# Patient Record
Sex: Female | Born: 1970
Health system: Southern US, Community
[De-identification: ages and names within clinical notes are randomized; demographics above are authoritative.]

## PROBLEM LIST (undated history)

## (undated) DIAGNOSIS — T07XXXA Unspecified multiple injuries, initial encounter: Secondary | ICD-10-CM

## (undated) HISTORY — DX: Unspecified multiple injuries, initial encounter: T07.XXXA

## (undated) HISTORY — PX: ABDOMINAL SURGERY: SHX537

## (undated) HISTORY — PX: OTHER SURGICAL HISTORY: SHX169

## (undated) HISTORY — PX: GANGLION CYST EXCISION: SHX1691

---
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
## 2000-04-26 HISTORY — PX: COLECTOMY: SHX59

## 2000-10-31 ENCOUNTER — Other Ambulatory Visit: Admission: RE | Admit: 2000-10-31 | Discharge: 2000-10-31 | Payer: Self-pay | Admitting: Obstetrics and Gynecology

## 2001-06-09 ENCOUNTER — Inpatient Hospital Stay (HOSPITAL_COMMUNITY): Admission: AD | Admit: 2001-06-09 | Discharge: 2001-06-11 | Payer: Self-pay | Admitting: Obstetrics & Gynecology

## 2001-07-20 ENCOUNTER — Ambulatory Visit (HOSPITAL_COMMUNITY): Admission: RE | Admit: 2001-07-20 | Discharge: 2001-07-20 | Payer: Self-pay | Admitting: Obstetrics & Gynecology

## 2003-10-24 ENCOUNTER — Ambulatory Visit (HOSPITAL_COMMUNITY): Admission: RE | Admit: 2003-10-24 | Discharge: 2003-10-24 | Payer: Self-pay | Admitting: Internal Medicine

## 2003-10-30 ENCOUNTER — Ambulatory Visit (HOSPITAL_COMMUNITY): Admission: RE | Admit: 2003-10-30 | Discharge: 2003-10-30 | Payer: Self-pay | Admitting: Internal Medicine

## 2004-02-14 ENCOUNTER — Ambulatory Visit (HOSPITAL_COMMUNITY): Admission: RE | Admit: 2004-02-14 | Discharge: 2004-02-14 | Payer: Self-pay | Admitting: Internal Medicine

## 2007-04-24 ENCOUNTER — Other Ambulatory Visit: Admission: RE | Admit: 2007-04-24 | Discharge: 2007-04-24 | Payer: Self-pay | Admitting: Obstetrics and Gynecology

## 2007-04-27 HISTORY — PX: VAGINAL HYSTERECTOMY: SHX2639

## 2007-10-11 ENCOUNTER — Encounter: Payer: Self-pay | Admitting: Obstetrics & Gynecology

## 2007-10-11 ENCOUNTER — Inpatient Hospital Stay (HOSPITAL_COMMUNITY): Admission: RE | Admit: 2007-10-11 | Discharge: 2007-10-13 | Payer: Self-pay | Admitting: Obstetrics & Gynecology

## 2010-03-30 ENCOUNTER — Ambulatory Visit (HOSPITAL_COMMUNITY)
Admission: RE | Admit: 2010-03-30 | Discharge: 2010-03-30 | Payer: Self-pay | Source: Home / Self Care | Admitting: Internal Medicine

## 2010-09-08 NOTE — Op Note (Signed)
[name], Alexandra Espinoza                 ACCOUNT NO.:  [id number]   MEDICAL RECORD NO.:  [id number]          PATIENT TYPE:  INP   LOCATION:  A308                          FACILITY:  [facility]   PHYSICIAN:  Lazaro Arms, M.D.   DATE OF BIRTH:  01/07/1971   DATE OF PROCEDURE:  10/11/2007  DATE OF DISCHARGE:                               OPERATIVE REPORT   PREOPERATIVE DIAGNOSES:  1. Dysmenorrhea.  2. Dyspareunia.   POSTOPERATIVE DIAGNOSES:  1. Dysmenorrhea.  2. Dyspareunia.   PROCEDURE:  Abdominal hysterectomy.   SURGEON:  Lazaro Arms, MD.   ANESTHESIA:  General endotracheal.   FINDINGS:  The patient had what appeared to be a normal uterus.  She  does have some adhesions of her uterus and anterior wall from previous  incision.  Crohn's disease appear to be quite.  Small bowel was  evaluated and found to be normal.  Ovaries were normal.   DESCRIPTION OF OPERATION:  The patient was taken to the operating room  and placed in supine position where she underwent general endotracheal  anesthesia.  The vagina was prepped, Foley catheter was placed.  The  abdomen was then prepped and draped in the usual sterile fashion.  A  Pfannenstiel skin incision was made, carried down sharply to the rectus  fascia which was scored in midline and extended laterally.  The fascia  was taken off the muscle superiorly and inferiorly without difficulty.  The muscles were divided.  Peritoneal cavity was entered.  An Alexis  self-retaining wound retractor was then placed.  The uterine cornu were  grasped.  The round ligaments were suture ligated and cut bilaterally.  The vesicouterine serosal flap was created.  The utero-ovarian ligaments  were then clamped, cut, and suture ligated bilaterally.  The uterine  vessels were skeletonized, clamped, cut, and suture ligated.  Serial  pedicles were taken down the cervix to the cardinal ligament.  Each  pedicle being clamped, cut, and suture ligated.  The vagina was  then  cross-clamped. Specimens were removed.  Vaginal angle sutures were  placed.  The vagina was closed with interrupted figure-of-eight sutures.  Pelvis was irrigated vigorously.  All pedicles were found to be  hemostatic.  Intercede was placed over the vaginal cuff to try to  prevent postoperative adhesions.  The wound retractor and the packing  laps were removed.  The muscles and peritoneum were reapproximated  loosely.  The fascia closed using 0 Vicryl running.  Subcutaneous tissue  was made hemostatic and irrigated and reapproximated loosely.  The skin  was closed using 3-0 Vicryl on a Keith needle in subcuticular fashion.  Dermabond was placed for  additional security.  The patient was awakened from anesthesia, taken to  recovery room in good stable condition.  All counts were correct.  She  experienced 150 mL of blood loss and received a gram of Ancef  prophylactically preoperatively.      Lazaro Arms, M.D.  Electronically Signed     LHE/MEDQ  D:  10/11/2007  T:  10/11/2007  Job:  [id number]

## 2010-09-11 NOTE — Discharge Summary (Signed)
Alexandra Espinoza, Alexandra Espinoza                 ACCOUNT NO.:  [id number]   MEDICAL RECORD NO.:  [id number]          PATIENT TYPE:  INP   LOCATION:  A308                          FACILITY:  [facility]   PHYSICIAN:  Lazaro Arms, M.D.   DATE OF BIRTH:  12-04-1970   DATE OF ADMISSION:  10/11/2007  DATE OF DISCHARGE:  06/19/2009LH                               DISCHARGE SUMMARY   DISCHARGE DIAGNOSES:  1. Status post total abdominal hysterectomy.  2. Unremarkable postoperative course.   PROCEDURE:  Total abdominal hysterectomy.   Please refer to the history and physical and the operative report for  details of admission to the hospital.   HOSPITAL COURSE:  The patient had unremarkable postoperative course.  She tolerated clear liquids and a regular diet, voided without symptoms,  and was extensively ambulatory.  Her postop day #1, hemoglobin was 10.8  and hematocrit of 31.6; postop day #2 was 10.4 and 30.5, and white count  was 6900.  She was afebrile.  She had progression with normal bowel  function.  Incision was clean, dry, and intact.  She was discharged in  the morning of postop day #2 in good stable condition.  Follow up in the  office next week for incision evaluation.  She was given routine  postoperative instructions.  She was given Percocet, Motrin, and  Phenergan for postop prescriptions.      Lazaro Arms, M.D.  Electronically Signed     LHE/MEDQ  D:  12/22/2007  T:  12/22/2007  Job:  [id number]

## 2010-09-11 NOTE — Op Note (Signed)
[facility]  Patient:    Alexandra Espinoza, Alexandra Espinoza Visit Number: [id number] MRN: [id number]          Service Type: OBS Location: 4A A417 01 Attending Physician:  Lazaro Arms Dictated by:   Rockne Coons., M.D. Proc. Date: 06/09/01 Admit Date:  06/09/2001                             Operative Report  DELIVERY NOTE  INDICATIONS:  Raaga is 38-3/[redacted] weeks gestation admitted in prodromal labor.  DESCRIPTION OF PROCEDURE:  She underwent an amniotomy and labored spontaneously without difficulty.  She had some variables and/or head compression with pushing but otherwise strip looked great.  There was clear amniotic fluid.  The patient had an approximately 40 minute second stage of labor and was delivered over an intact perineum of a viable female at 2040 with Apgars of 8 and 9, weight 6 pounds 13 ounces.  There was a three vessel cord.  Cord blood was sent.  The placenta was normal and intact.  The uterus was firm after delivery.  The infant was handed to the nursery nurse who was in attendance for routine neonatal resuscitation on the mothers abdomen.  The baby had spontaneous respirations and again had Apgars of 8 and 9.  There was a first degree peroneal laceration which was repaired without difficulty, and the patient will be continued on Oxytocin stimulation over the next 8-12 hours and she will undergo routine postpartum care.  She is B positive and rubella is not on the chart.  We will attempt to try to retain that.  She is group B strep negative and she is breast feeding.  I anticipate routine postpartum care. Dictated by:   Rockne Coons., M.D. Attending Physician:  Lazaro Arms DD:  06/09/01 TD:  06/10/01 Job: [id number]

## 2010-09-11 NOTE — H&P (Signed)
[facility]  Patient:    Alexandra Espinoza, Alexandra Espinoza Visit Number: [id number] MRN: [id number]          Service Type: OBS Location: 4A A417 01 Attending Physician:  Lazaro Arms Dictated by:   Duane Lope, M.D. Admit Date:  06/09/2001                           History and Physical  DATE OF BIRTH:  1971-02-20  HISTORY OF PRESENT ILLNESS:  The patient is a 40 year old white female, gravida 3, para 2, abortus 0, with an estimated date of delivery of June 16, 2001, by last menstrual period and a six-week sonogram.  Currently at 38-3/[redacted] weeks gestation.  The patient was seen in the office today complaining of some spotting and cramping.  Her cervix was 2-3, 50%, and about a -2 station.  She was felt to be in prodromal labor with some bloody discharge. She was sent over to labor and delivery and had a reactive NST and was having infrequent contractions.  As a result, she was sent to the ambulatory with the idea that she would pick up and continue with amniotomy.  PAST MEDICAL HISTORY: 1. Crohns disease.  She had two feet of her large intestine removed in    April 2002. 2. Exploratory laparotomy and appendectomy in 1999.  That was all done by Dr. Byrd Hesselbach over at [facility].  OBSTETRICAL HISTORY:  Two vaginal deliveries, in 1992 and 1996, both in the 4-pound range and weight.  MEDICATIONS:  Prenatal vitamins.  ALLERGIES:  PENICILLIN and FLAGYL.  REVIEW OF SYSTEMS:  Otherwise negative.  FAMILY HISTORY:  Otherwise noncontributory to the pregnancy.  PHYSICAL EXAMINATION:  VITAL SIGNS:  Weight 157 pounds.  Blood pressure 124/78.  HEENT:  Unremarkable.  NECK:  Normal thyroid.  LUNGS:  Clear.  BREASTS:  Deferred.  HEART:  Regular rate and rhythm, with 2/6 systolic ejection murmur.  ABDOMEN:  Gravid.  Fundal height of 36 cm.  PELVIC:  Cervix is 2-3, 50, and -1 to -2.  There is a reactive NST.  EXTREMITIES:  Warm, with trace  edema.  NEUROLOGIC:  Grossly intact.  IMPRESSION: 1. Intrauterine pregnancy at term. 2. Early prodromal labor.  PLAN:  Will let patient ambulate, and plan amniotomy and/or augmentation later.  LABORATORY DATA:  Blood type is B positive.  Rubella is immune.  The rest of her laboratory data is normal. Dictated by:   Duane Lope, M.D. Attending Physician:  Lazaro Arms DD:  06/09/01 TD:  06/10/01 Job: [id number]

## 2010-09-11 NOTE — H&P (Signed)
[facility]  Patient:    Alexandra Espinoza, Alexandra Espinoza Visit Number: [id number] MRN: [id number]          Service Type: OBS Location: 4A A417 01 Attending Physician:  Lazaro Arms Dictated by:   Duane Lope, M.D. Admit Date:  06/09/2001 Discharge Date: 06/11/2001                           History and Physical  DATE OF BIRTH:  [birth year]  REASON FOR ADMISSION:  An outpatient laparoscopic tubal ligation.  HISTORY OF PRESENT ILLNESS:  Alexandra Espinoza is a 40 year old white female, gravida 3, para 3, who has not been sexually active at all since her delivery on the 14th, who is admitted for a laparoscopic bilateral tubal ligation.  She understands the risk of failure of about 1 in 200 and the permanent nature of the sterilization.  Complicating matters somewhat is her history of Crohns disease, since she has been diagnosed since 1990 when she had an exploration and appendectomy and she was found to have Crohns.  She subsequently had an exploratory laparotomy with a small-bowel resection and what sounds like some ileocecal area resection of about two feet in March of 2002; she became pregnant shortly thereafter.  She has had no problems from her Crohns since then and is on no medications.  PAST MEDICAL HISTORY:  Crohns.  PAST SURGICAL HISTORY:  Appendectomy in 1990 and exploratory laparotomy in March of 2002.  PAST OBSTETRICAL HISTORY:  Three vaginal deliveries.  REVIEW OF SYSTEMS:  Otherwise negative.  MEDICINES:  Currently none.  ALLERGIES:  PENICILLIN and FLAGYL.  PHYSICAL EXAMINATION:  VITAL SIGNS:  Weight is 141 pounds.  Blood pressure is 120/80.  HEENT/NECK:  Unremarkable with normal thyroid.  LUNGS:  Clear.  HEART:  Regular rate and rhythm without murmur, regurgitation or gallop.  BREASTS:  Exam is deferred.  ABDOMEN:  Well-healed midline scar and a right lower quadrant McBurneys scar. The abdominal exam is otherwise benign.  PELVIC:  She has  normal external genitalia.  Vagina is pink and moist without discharge.  Cervix is parous without lesions.  Uterus is slightly retroverted. The ovaries are normal size and nontender.  EXTREMITIES:  Warm with no edema.  NEUROLOGIC:  Exam is grossly intact.  IMPRESSION: 1. Desires permanent sterilization. 2. History of Crohns disease with exploratory laparotomy.  PLAN:  Patient will be admitted as an outpatient on the 27th for laparoscopic tubal ligation.  She understands, however, that due to her Crohns disease, may require a laparotomy and I told her we would do that if needed and she agrees with that; as a result, the permit will say laparoscopic bilateral tubal ligation and possible minilaparotomy. Dictated by:   Duane Lope, M.D. Attending Physician:  Lazaro Arms DD:  07/07/01 TD:  07/08/01 Job: [id number]
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
## 2011-01-21 LAB — DIFFERENTIAL
Basophils Relative: 0
Eosinophils Absolute: 0
Eosinophils Absolute: 0
Eosinophils Relative: 0
Eosinophils Relative: 1
Lymphocytes Relative: 6 — ABNORMAL LOW
Lymphs Abs: 1.3
Monocytes Absolute: 0.7
Monocytes Relative: 10

## 2011-01-21 LAB — URINALYSIS, ROUTINE W REFLEX MICROSCOPIC
Glucose, UA: NEGATIVE
Hgb urine dipstick: NEGATIVE
Protein, ur: NEGATIVE

## 2011-01-21 LAB — CBC
HCT: 35.9 — ABNORMAL LOW
Hemoglobin: 10.4 — ABNORMAL LOW
Hemoglobin: 10.8 — ABNORMAL LOW
MCHC: 33.9
MCV: 74.4 — ABNORMAL LOW
Platelets: 191
Platelets: 256
RBC: 4.03
RBC: 4.21
RBC: 4.82
RDW: 16.6 — ABNORMAL HIGH
WBC: 11.9 — ABNORMAL HIGH
WBC: 5.8
WBC: 6.9

## 2011-01-21 LAB — COMPREHENSIVE METABOLIC PANEL
ALT: 17
AST: 21
Albumin: 4.2
Calcium: 9.7
Creatinine, Ser: 0.67
Potassium: 4.2
Sodium: 138
Total Protein: 6.9

## 2011-03-24 DIAGNOSIS — R079 Chest pain, unspecified: Secondary | ICD-10-CM

## 2011-12-21 ENCOUNTER — Other Ambulatory Visit (HOSPITAL_COMMUNITY): Payer: Self-pay | Admitting: Internal Medicine

## 2011-12-21 DIAGNOSIS — Z139 Encounter for screening, unspecified: Secondary | ICD-10-CM

## 2011-12-23 ENCOUNTER — Ambulatory Visit (HOSPITAL_COMMUNITY)
Admission: RE | Admit: 2011-12-23 | Discharge: 2011-12-23 | Disposition: A | Discharge status: Home / Self Care | Payer: BC Managed Care – PPO | Source: Ambulatory Visit | Attending: Internal Medicine | Admitting: Internal Medicine

## 2011-12-23 DIAGNOSIS — Z139 Encounter for screening, unspecified: Secondary | ICD-10-CM

## 2011-12-23 DIAGNOSIS — Z1231 Encounter for screening mammogram for malignant neoplasm of breast: Secondary | ICD-10-CM | POA: Insufficient documentation

## 2016-02-24 DIAGNOSIS — M79673 Pain in unspecified foot: Secondary | ICD-10-CM | POA: Diagnosis not present

## 2016-02-24 DIAGNOSIS — G5762 Lesion of plantar nerve, left lower limb: Secondary | ICD-10-CM | POA: Diagnosis not present

## 2016-03-16 DIAGNOSIS — M79673 Pain in unspecified foot: Secondary | ICD-10-CM | POA: Diagnosis not present

## 2016-03-16 DIAGNOSIS — G5762 Lesion of plantar nerve, left lower limb: Secondary | ICD-10-CM | POA: Diagnosis not present

## 2017-09-08 DIAGNOSIS — Z0000 Encounter for general adult medical examination without abnormal findings: Secondary | ICD-10-CM | POA: Diagnosis not present

## 2017-09-15 DIAGNOSIS — M545 Low back pain: Secondary | ICD-10-CM | POA: Diagnosis not present

## 2017-09-15 DIAGNOSIS — M79606 Pain in leg, unspecified: Secondary | ICD-10-CM | POA: Diagnosis not present

## 2017-09-15 DIAGNOSIS — Z0000 Encounter for general adult medical examination without abnormal findings: Secondary | ICD-10-CM | POA: Diagnosis not present

## 2017-09-15 DIAGNOSIS — G8929 Other chronic pain: Secondary | ICD-10-CM | POA: Diagnosis not present

## 2017-09-21 ENCOUNTER — Telehealth: Payer: Self-pay | Admitting: Orthopedic Surgery

## 2017-09-21 NOTE — Telephone Encounter (Signed)
Patient called to inquire about appointment for knee pain, also mentions back pain - said she had a physical with primary care, Dr Margo Aye and Toni Amend, [place] - states they will send a referral if we require it. Relayed that due to requesting appointment for the medical problem(s) for which she has already been seen, we will need a referral so that our providers have the recent treatment notes.

## 2017-10-17 ENCOUNTER — Encounter: Payer: Self-pay | Admitting: Orthopedic Surgery

## 2017-10-17 ENCOUNTER — Ambulatory Visit (INDEPENDENT_AMBULATORY_CARE_PROVIDER_SITE_OTHER): Payer: BLUE CROSS/BLUE SHIELD | Admitting: Orthopedic Surgery

## 2017-10-17 ENCOUNTER — Ambulatory Visit (INDEPENDENT_AMBULATORY_CARE_PROVIDER_SITE_OTHER): Payer: BLUE CROSS/BLUE SHIELD

## 2017-10-17 VITALS — BP 114/65 | HR 69 | Ht 62.0 in | Wt 165.0 lb

## 2017-10-17 DIAGNOSIS — M79605 Pain in left leg: Secondary | ICD-10-CM | POA: Diagnosis not present

## 2017-10-17 DIAGNOSIS — M545 Low back pain: Secondary | ICD-10-CM | POA: Diagnosis not present

## 2017-10-17 DIAGNOSIS — M25561 Pain in right knee: Secondary | ICD-10-CM

## 2017-10-17 DIAGNOSIS — G8929 Other chronic pain: Secondary | ICD-10-CM

## 2017-10-17 DIAGNOSIS — G609 Hereditary and idiopathic neuropathy, unspecified: Secondary | ICD-10-CM | POA: Diagnosis not present

## 2017-10-17 DIAGNOSIS — M170 Bilateral primary osteoarthritis of knee: Secondary | ICD-10-CM | POA: Diagnosis not present

## 2017-10-17 DIAGNOSIS — M25562 Pain in left knee: Secondary | ICD-10-CM | POA: Diagnosis not present

## 2017-10-17 MED ORDER — DICLOFENAC SODIUM 1 % TD GEL: 4.0000 g | Freq: Four times a day (QID) | TRANSDERMAL | 3 refills | Status: AC

## 2017-10-17 NOTE — Patient Instructions (Signed)
Encounter Diagnoses  Name Primary?  . Lumbar pain with radiation down left leg Yes  . Chronic pain of both knees   . Primary osteoarthritis of both knees   . Idiopathic peripheral neuropathy     Continue chiropractic treatment for lumbar back pain  Start Voltaren gel 4 g every 6 hours for knee pain  See primary care for peripheral neuropathy medication suggest gabapentin

## 2017-10-17 NOTE — Progress Notes (Signed)
NEW PATIENT OFFICE VISIT  Chief Complaint  Patient presents with  . Back Pain    lower back left lower leg painful into left foot  . Knee Pain    bilateral knees painful couple years, more constant now    47 years old comes in with multiple complaints.  #1 bilateral knee pain on and off for several years now become constant worse after standing on her feet all day  2.  Numbness and tingling both feet  3.  Left lower leg pain radiating from lower back pain  She has tried a topical medication for her feet and legs it has not helped.  She has been seeing a chiropractor which gave her some relief.  Summation:  Dull aching bilateral knee pain worse with standing unrelieved with regular Tylenol present now for several years  Her lumbar spine has been present for years she saw a chiropractor before got relief is seen one now  Her "neuropathy" is not better   Review of Systems  Constitutional: Negative for chills and fever.  Musculoskeletal: Positive for back pain.  Neurological: Positive for tingling and sensory change. Negative for focal weakness and weakness.     Past Medical History:  Diagnosis Date  . #[id number]    left foot fracture    Past Surgical History:  Procedure Laterality Date  . ABDOMINAL SURGERY    . APPENDECTOMY  1990  . COLECTOMY  2002  . GANGLION CYST EXCISION     wrist  . tubal ligation    . VAGINAL HYSTERECTOMY  2009    Family History  Problem Relation Age of Onset  . Diabetes Mother   . Arthritis Mother   . Heart disease Father   . Arthritis Father    Social History   Tobacco Use  . Smoking status: Never Smoker  . Smokeless tobacco: Never Used  Substance Use Topics  . Alcohol use: Not Currently  . Drug use: Not on file    Allergies  Allergen Reactions  . Metronidazole Diarrhea and Nausea And Vomiting  . Penicillin G Nausea Only    Current Meds  Medication Sig  . acetaminophen (TYLENOL) 500 MG tablet Take 500 mg by mouth every 6  (six) hours as needed.    BP 114/65   Pulse 69   Ht 5\' 2"  (1.575 m)   Wt 165 lb (74.8 kg)   BMI 30.18 kg/m   Physical Exam  Ortho Exam    MEDICAL DECISION SECTION  Xrays were done at Knee x-rays mild arthritis medial compartment right knee  Mild arthritis medial compartment left knee  Lumbar spine scoliosis loss of lordosis and facet arthrosis lower lumbar spine with preserved disc spaces, note anterior osteophyte L4.  Encounter Diagnoses  Name Primary?  . Lumbar pain with radiation down left leg   . Chronic pain of both knees   . Primary osteoarthritis of both knees Yes  . Idiopathic peripheral neuropathy     PLAN: (Rx., injectx, surgery, frx, mri/ct) At this point the patient does not need any surgery.  I did not really have much to offer her.  I did offer her Voltaren gel because she has had significant portion of her intestine removed and is probably not good to give her NSAIDs.  She has not had any therapy.  The chiropractor can take care of her nonoperative back disease  I would suggest perhaps some gabapentin to help with her peripheral neuropathy or see a neurologist for that.  As far  as her knees goes at 47 she has nonoperative knee arthritis which can be managed without surgery  No follow-up scheduled  Meds ordered this encounter  Medications  . diclofenac sodium (VOLTAREN) 1 % GEL    Sig: Apply 4 g topically 4 (four) times daily.    Dispense:  5 Tube    Refill:  3    Fuller Canada, MD  10/17/2017 5:23 PM

## 2018-07-04 ENCOUNTER — Emergency Department (HOSPITAL_COMMUNITY): Payer: BLUE CROSS/BLUE SHIELD

## 2018-07-04 ENCOUNTER — Other Ambulatory Visit: Payer: Self-pay

## 2018-07-04 ENCOUNTER — Emergency Department (HOSPITAL_COMMUNITY)
Admission: EM | Admit: 2018-07-04 | Discharge: 2018-07-04 | Disposition: A | Discharge status: Home / Self Care | Payer: BLUE CROSS/BLUE SHIELD | Attending: Emergency Medicine | Admitting: Emergency Medicine

## 2018-07-04 ENCOUNTER — Encounter (HOSPITAL_COMMUNITY): Payer: Self-pay | Admitting: Emergency Medicine

## 2018-07-04 DIAGNOSIS — Z79899 Other long term (current) drug therapy: Secondary | ICD-10-CM | POA: Insufficient documentation

## 2018-07-04 DIAGNOSIS — M5030 Other cervical disc degeneration, unspecified cervical region: Secondary | ICD-10-CM | POA: Diagnosis not present

## 2018-07-04 DIAGNOSIS — M545 Low back pain: Secondary | ICD-10-CM | POA: Diagnosis not present

## 2018-07-04 DIAGNOSIS — M5136 Other intervertebral disc degeneration, lumbar region: Secondary | ICD-10-CM

## 2018-07-04 DIAGNOSIS — R29818 Other symptoms and signs involving the nervous system: Secondary | ICD-10-CM | POA: Diagnosis not present

## 2018-07-04 DIAGNOSIS — R202 Paresthesia of skin: Secondary | ICD-10-CM | POA: Diagnosis not present

## 2018-07-04 DIAGNOSIS — M542 Cervicalgia: Secondary | ICD-10-CM | POA: Diagnosis not present

## 2018-07-04 DIAGNOSIS — M50323 Other cervical disc degeneration at C6-C7 level: Secondary | ICD-10-CM | POA: Diagnosis not present

## 2018-07-04 DIAGNOSIS — R42 Dizziness and giddiness: Secondary | ICD-10-CM | POA: Diagnosis not present

## 2018-07-04 DIAGNOSIS — M549 Dorsalgia, unspecified: Secondary | ICD-10-CM | POA: Diagnosis not present

## 2018-07-04 LAB — CBC WITH DIFFERENTIAL/PLATELET
Abs Immature Granulocytes: 0.01 10*3/uL (ref 0.00–0.07)
Basophils Absolute: 0 10*3/uL (ref 0.0–0.1)
Basophils Relative: 1 %
Eosinophils Absolute: 0.1 10*3/uL (ref 0.0–0.5)
Eosinophils Relative: 1 %
HCT: 44.1 % (ref 36.0–46.0)
HEMOGLOBIN: 14.5 g/dL (ref 12.0–15.0)
Immature Granulocytes: 0 %
LYMPHS ABS: 1.8 10*3/uL (ref 0.7–4.0)
Lymphocytes Relative: 31 %
MCH: 28.9 pg (ref 26.0–34.0)
MCHC: 32.9 g/dL (ref 30.0–36.0)
MCV: 87.8 fL (ref 80.0–100.0)
Monocytes Absolute: 0.3 10*3/uL (ref 0.1–1.0)
Monocytes Relative: 5 %
Neutro Abs: 3.6 10*3/uL (ref 1.7–7.7)
Neutrophils Relative %: 62 %
Platelets: 253 10*3/uL (ref 150–400)
RBC: 5.02 MIL/uL (ref 3.87–5.11)
RDW: 13.3 % (ref 11.5–15.5)
WBC: 5.9 10*3/uL (ref 4.0–10.5)
nRBC: 0 % (ref 0.0–0.2)

## 2018-07-04 LAB — BASIC METABOLIC PANEL
Anion gap: 8 (ref 5–15)
BUN: 11 mg/dL (ref 6–20)
CO2: 24 mmol/L (ref 22–32)
CREATININE: 0.7 mg/dL (ref 0.44–1.00)
Calcium: 9.2 mg/dL (ref 8.9–10.3)
Chloride: 107 mmol/L (ref 98–111)
GFR calc Af Amer: >60 mL/min (ref >60)
GFR calc non Af Amer: >60 mL/min (ref >60)
Glucose, Bld: 95 mg/dL (ref 70–99)
Potassium: 3.9 mmol/L (ref 3.5–5.1)
Sodium: 139 mmol/L (ref 135–145)

## 2018-07-04 MED ORDER — CYCLOBENZAPRINE HCL 5 MG PO TABS: 5.0000 mg | ORAL_TABLET | Freq: Three times a day (TID) | ORAL | 0 refills | Status: AC | PRN

## 2018-07-04 MED ORDER — HYDROCODONE-ACETAMINOPHEN 5-325 MG PO TABS: 1.0000 | ORAL_TABLET | ORAL | 0 refills | Status: AC | PRN

## 2018-07-04 MED ORDER — ONDANSETRON HCL 4 MG/2ML IJ SOLN
4.0000 mg | Freq: Once | INTRAMUSCULAR | Status: AC
  Administered 2018-07-04: 4 mg via INTRAVENOUS
  Filled 2018-07-04: qty 2

## 2018-07-04 MED ORDER — HYDROMORPHONE HCL 1 MG/ML IJ SOLN
0.5000 mg | Freq: Once | INTRAMUSCULAR | Status: AC
Start: 2018-07-04 — End: 2018-07-04
  Administered 2018-07-04: 0.5 mg via INTRAVENOUS
  Filled 2018-07-04: qty 1

## 2018-07-04 MED ORDER — DEXAMETHASONE SODIUM PHOSPHATE 10 MG/ML IJ SOLN
10.0000 mg | Freq: Once | INTRAMUSCULAR | Status: AC
  Administered 2018-07-04: 10 mg via INTRAVENOUS
  Filled 2018-07-04: qty 1

## 2018-07-04 MED ORDER — PREDNISONE 10 MG PO TABS: ORAL_TABLET | ORAL | 0 refills | Status: AC

## 2018-07-04 NOTE — ED Notes (Signed)
Pt requested to walk to restroom before EKG. Pt ambulating independently.

## 2018-07-04 NOTE — ED Triage Notes (Signed)
C/o lower back pain.  At work bent over and felt a pop.  Body tingles with any movement.  When turn she turns her head, she feels dizziness.

## 2018-07-04 NOTE — ED Provider Notes (Signed)
[facility] EMERGENCY DEPARTMENT Provider Note   CSN: [id number] Arrival date & time: 07/04/18  1032    History   Chief Complaint Chief Complaint  Patient presents with  . Back Pain    HPI Alexandra Espinoza is a 48 y.o. female with no significant past medical history but occasional low back pain, was told years ago she has a "disk problem" in her lower back, presenting with acute onset of lumbar pain when she bent at the waist prior to arrival while at work.  She reports sudden onset of a loud popping sensation followed by immediate pain which radiates upper her back along with numbness/tingling in her bilateral upper and lower extremities.  Pain is worsened with movement. She has been ambulatory and denies weakness in her extremities.  She also describes dizziness with head and neck rotations, describing a near passing out sensation (no loss of vision, but feels unsteady with fleeting room movement when turning her head) since the lumbar pain occurred.  She presents directly from work.  She has had no treatment prior to arrival.     The history is provided by the patient.    Past Medical History:  Diagnosis Date  . #[id number]    left foot fracture    There are no active problems to display for this patient.   Past Surgical History:  Procedure Laterality Date  . ABDOMINAL SURGERY    . APPENDECTOMY  1990  . COLECTOMY  2002  . GANGLION CYST EXCISION     wrist  . tubal ligation    . VAGINAL HYSTERECTOMY  2009     OB History   No obstetric history on file.      Home Medications    Prior to Admission medications   Medication Sig Start Date End Date Taking? Authorizing Provider  acetaminophen (TYLENOL) 500 MG tablet Take 500 mg by mouth every 6 (six) hours as needed.   Yes [provider]  Aspirin-Salicylamide-Caffeine (BC HEADACHE POWDER PO) Take 1 packet by mouth daily.   Yes [provider]  cyclobenzaprine (FLEXERIL) 5 MG tablet Take 1 tablet (5 mg  total) by mouth 3 (three) times daily as needed for muscle spasms. 07/04/18   Burgess Amor, PA-C  diclofenac sodium (VOLTAREN) 1 % GEL Apply 4 g topically 4 (four) times daily. Patient not taking: Reported on 07/04/2018 10/17/17   Vickki Hearing, MD  HYDROcodone-acetaminophen (NORCO/VICODIN) 5-325 MG tablet Take 1 tablet by mouth every 4 (four) hours as needed. 07/04/18   Burgess Amor, PA-C  predniSONE (DELTASONE) 10 MG tablet Take 6 tablets day one, 5 tablets day two, 4 tablets day three, 3 tablets day four, 2 tablets day five, then 1 tablet day six 07/04/18   Burgess Amor, PA-C    Family History Family History  Problem Relation Age of Onset  . Diabetes Mother   . Arthritis Mother   . Heart disease Father   . Arthritis Father     Social History Social History   Tobacco Use  . Smoking status: Never Smoker  . Smokeless tobacco: Never Used  Substance Use Topics  . Alcohol use: Not Currently  . Drug use: Not on file     Allergies   Metronidazole and Penicillin g   Review of Systems Review of Systems  Constitutional: Negative for fever.  Respiratory: Negative for shortness of breath.   Cardiovascular: Negative for chest pain and leg swelling.  Gastrointestinal: Negative for abdominal distention, abdominal pain and constipation.  Genitourinary:  Negative for difficulty urinating, dysuria, flank pain, frequency and urgency.  Musculoskeletal: Positive for back pain and neck pain. Negative for gait problem, joint swelling and neck stiffness.  Skin: Negative for rash.  Neurological: Positive for dizziness and light-headedness. Negative for weakness, numbness and headaches.  Psychiatric/Behavioral: Negative.      Physical Exam Updated Vital Signs BP 122/70 (BP Location: Left Arm)   Pulse 74   Temp 97.9 F (36.6 C) (Oral)   Resp 16   Ht 5\' 2"  (1.575 m)   Wt 77.1 kg   SpO2 93%   BMI 31.09 kg/m   Physical Exam Vitals signs and nursing note reviewed.  Constitutional:       Appearance: She is well-developed.  HENT:     Head: Normocephalic.     Right Ear: Tympanic membrane and ear canal normal.     Left Ear: Tympanic membrane and ear canal normal.  Eyes:     Extraocular Movements:     Right eye: Normal extraocular motion and no nystagmus.     Left eye: Normal extraocular motion and no nystagmus.     Conjunctiva/sclera: Conjunctivae normal.  Neck:     Musculoskeletal: Full passive range of motion without pain, normal range of motion and neck supple. Spinous process tenderness present. No pain with movement.     Comments: ttp lower cervical midline.  No edema, no palpable deformity. Cardiovascular:     Rate and Rhythm: Normal rate.     Comments: Pedal pulses normal. Pulmonary:     Effort: Pulmonary effort is normal.  Abdominal:     General: Bowel sounds are normal. There is no distension.     Palpations: Abdomen is soft. There is no mass.  Musculoskeletal: Normal range of motion.     Lumbar back: She exhibits tenderness and bony tenderness. She exhibits no swelling, no edema and no spasm.  Skin:    General: Skin is warm and dry.  Neurological:     Mental Status: She is alert.     Sensory: Sensory deficit present.     Motor: No tremor or atrophy.     Gait: Gait normal.     Deep Tendon Reflexes:     Reflex Scores:      Bicep reflexes are 2+ on the right side and 2+ on the left side.      Patellar reflexes are 2+ on the right side and 2+ on the left side.      Achilles reflexes are 2+ on the right side and 2+ on the left side.    Comments: No strength deficit noted in hip and knee flexor and extensor muscle groups.  Ankle flexion and extension intact. Normal heel/shin. Ambulatory, no foot drop.  Equal grip strength. Decreased sensation to sharp touch bilateral lower lateral legs. Sensation to sharp and dull normal in arms and hands.      ED Treatments / Results  Labs (all labs ordered are listed, but only abnormal results are displayed) Labs  Reviewed  CBC WITH DIFFERENTIAL/PLATELET  BASIC METABOLIC PANEL    EKG EKG Interpretation  Date/Time:  Tuesday July 04 2018 12:42:09 EDT Ventricular Rate:  58 PR Interval:    QRS Duration: 82 QT Interval:  420 QTC Calculation: 413 R Axis:   48 Text Interpretation:  Sinus rhythm Abnormal T, consider ischemia, anterior leads Baseline wander in lead(s) V3 Confirmed by Geoffery LyonseLo, Douglas ([id number]) on 07/04/2018 3:04:33 PM   Radiology Dg Cervical Spine Complete  Result Date: 07/04/2018 CLINICAL DATA:  Initial evaluation for acute neck pain. EXAM: CERVICAL SPINE - COMPLETE 4+ VIEW COMPARISON:  None. FINDINGS: Straightening of the normal cervical lordosis. No listhesis or malalignment. Vertebral body heights maintained. Normal C1-2 articulations are preserved in the dens is intact. Degenerative intervertebral disc space narrowing present at C6-7. Moderate bony foraminal narrowing seen on the right at C5-6 due to uncovertebral disease. Mild left bony foraminal narrowing at C6-7. Prevertebral soft tissues within normal limits. Visualized soft tissues of the neck unremarkable. Visualized upper lungs are grossly clear. IMPRESSION: 1. No radiographic evidence for acute abnormality within the cervical spine. 2. Degenerative intervertebral disc space narrowing at C6-7, with bilateral bony foraminal narrowing at C5-6 and C6-7 as above. Electronically Signed   By: Rise Mu M.D.   On: 07/04/2018 13:47   Dg Lumbar Spine Complete  Result Date: 07/04/2018 CLINICAL DATA:  Initial evaluation for acute lumbar radiculopathy, back pain. EXAM: LUMBAR SPINE - COMPLETE 4+ VIEW COMPARISON:  Prior radiograph from 10/17/2017. FINDINGS: Five non rib-bearing lumbar type vertebral bodies. Mild levoscoliosis with trace retrolisthesis of L4 on L5. Vertebral body heights maintained without evidence for acute or chronic fracture. Visualized sacrum and pelvis intact. Mild degenerative intervertebral disc space narrowing  within the lower lumbar spine, most pronounced at L3-4. No acute soft tissue abnormality.  Aortic atherosclerosis noted. IMPRESSION: 1. No radiographic evidence for acute abnormality within the lumbar spine. 2. Mild multilevel degenerative spondylolysis, most pronounced at L3-4. Electronically Signed   By: Rise Mu M.D.   On: 07/04/2018 13:42   Mr Brain Wo Contrast  Result Date: 07/04/2018 CLINICAL DATA:  Initial evaluation for focal neural deficit, stroke suspected. EXAM: MRI HEAD WITHOUT CONTRAST TECHNIQUE: Multiplanar, multiecho pulse sequences of the brain and surrounding structures were obtained without intravenous contrast. COMPARISON:  None. FINDINGS: Brain: Cerebral volume within normal limits for patient age. No focal parenchymal signal abnormality identified. No abnormal foci of restricted diffusion to suggest acute or subacute ischemia. Gray-white matter differentiation well maintained. No encephalomalacia to suggest chronic infarction. No foci of susceptibility artifact to suggest acute or chronic intracranial hemorrhage. No mass lesion, midline shift or mass effect. No hydrocephalus. No extra-axial fluid collection. Major dural sinuses are grossly patent. Pituitary gland and suprasellar region are normal. Midline structures intact and normal. Vascular: Major intracranial vascular flow voids well maintained and normal in appearance. Skull and upper cervical spine: Craniocervical junction normal. Visualized upper cervical spine within normal limits. Bone marrow signal intensity normal. No scalp soft tissue abnormality. Sinuses/Orbits: Globes and orbital soft tissues within normal limits. Paranasal sinuses are clear. No mastoid effusion. Inner ear structures normal. Other: None. IMPRESSION: Normal brain MRI.  No acute intracranial abnormality identified. Electronically Signed   By: Rise Mu M.D.   On: 07/04/2018 16:27   Mr Cervical Spine Wo Contrast  Result Date:  07/04/2018 CLINICAL DATA:  Initial evaluation for radiculopathy, diffuse tingling, dizziness. EXAM: MRI CERVICAL SPINE WITHOUT CONTRAST TECHNIQUE: Multiplanar, multisequence MR imaging of the cervical spine was performed. No intravenous contrast was administered. COMPARISON:  Prior radiograph from earlier same day. FINDINGS: Alignment: Straightening of the normal cervical lordosis. No listhesis or malalignment. Vertebrae: Vertebral body heights maintained without evidence for acute or chronic fracture. Bone marrow signal intensity within normal limits. 4 mm T1/T2 hypointense lesion within the C3 vertebral body most likely reflects a small benign bone island. No other discrete or worrisome osseous lesions. No abnormal marrow edema. Cord: Signal intensity within the cervical spinal cord is normal. Posterior Fossa, vertebral arteries, paraspinal tissues:  Visualized brain and posterior fossa within normal limits. Craniocervical junction normal. Paraspinous and prevertebral soft tissues within normal limits. Normal intravascular flow voids seen within the vertebral arteries bilaterally. Disc levels: C2-C3: Unremarkable. C3-C4:  Unremarkable. C4-C5:  Unremarkable. C5-C6:  Unremarkable. C6-C7: Diffuse disc bulge with disc desiccation and intervertebral disc space narrowing. Superimposed broad-based central disc protrusion indents the ventral thecal sac. Slight cephalad and caudad migration of disc material. Resultant mild spinal stenosis without significant cord deformity. Superimposed bilateral uncovertebral hypertrophy, greater on the left. Resultant moderate left C7 foraminal stenosis. No significant right foraminal encroachment. C7-T1:  Unremarkable. Visualized upper thoracic spine demonstrates no significant finding. IMPRESSION: 1. Broad-based central disc protrusion at C6-7 with resultant mild canal with moderate left C7 foraminal stenosis. 2. Otherwise essentially normal MRI of the cervical spine. No other acute  abnormality identified. Electronically Signed   By: Rise Mu M.D.   On: 07/04/2018 16:16   Mr Lumbar Spine Wo Contrast  Result Date: 07/04/2018 CLINICAL DATA:  Initial evaluation for acute lower back pain, tingling. EXAM: MRI LUMBAR SPINE WITHOUT CONTRAST TECHNIQUE: Multiplanar, multisequence MR imaging of the lumbar spine was performed. No intravenous contrast was administered. COMPARISON:  Prior radiograph from earlier the same day. FINDINGS: Segmentation: Standard. Lowest well-formed disc labeled the L5-S1 level. Alignment: Mild levoscoliosis. Alignment otherwise normal with preservation of the normal lumbar lordosis. Vertebrae: Vertebral body heights maintained without evidence for acute or chronic fracture. Bone marrow signal intensity within normal limits. No discrete or worrisome osseous lesions. No abnormal marrow edema. Conus medullaris and cauda equina: Conus extends to the L1 level. Conus and cauda equina appear normal. Paraspinal and other soft tissues: Paraspinous soft tissues within normal limits. Visualized visceral structures unremarkable. Disc levels: L1-2:  Unremarkable. L2-3: Disc desiccation without significant disc bulge. No canal or foraminal stenosis. L3-4: Disc desiccation with mild annular disc bulging, slightly asymmetric to the left. Small annular fissure at the level the left neural foramen noted. No significant canal or lateral recess stenosis. Foramina remain patent. No impingement. L4-5: Mild annular disc bulging with disc desiccation. Disc bulging slightly eccentric to the left. Minimal facet hypertrophy. No canal or lateral recess stenosis. Mild left L4 foraminal narrowing. No impingement. L5-S1: Shallow broad-based central disc protrusion with associated annular fissure. Protruding disc slightly eccentric to the left. Disc protrusion closely approximates the descending S1 nerve roots without frank neural impingement or displacement. No significant stenosis.  IMPRESSION: 1. Shallow central disc protrusion at L5-S1, closely approximating the descending S1 nerve roots without frank neural impingement. 2. Mild noncompressive disc bulging at L3-4 and L4-5 without significant stenosis or neural impingement. 3. Underlying mild levoscoliosis. Electronically Signed   By: Rise Mu M.D.   On: 07/04/2018 16:32    Procedures Procedures (including critical care time)  Medications Ordered in ED Medications  HYDROmorphone (DILAUDID) injection 0.5 mg (0.5 mg Intravenous Given 07/04/18 1344)  ondansetron (ZOFRAN) injection 4 mg (4 mg Intravenous Given 07/04/18 1345)  dexamethasone (DECADRON) injection 10 mg (10 mg Intravenous Given 07/04/18 1345)     Initial Impression / Assessment and Plan / ED Course  I have reviewed the triage vital signs and the nursing notes.  Pertinent labs & imaging results that were available during my care of the patient were reviewed by me and considered in my medical decision making (see chart for details).        Pt given IV dilaudid and decadron with no improvement in pain or numbness, persistent vertiginous sx with head rotation.  Pt  seen by Dr Judd Lien during this visit, added MRI brain, c and lumbar spine with results of degenerative disk disease without cord compression or surgical concerns.  Mri brain negative for acute stroke or central source of vertigo.  She continued to have tingling in all 4 extremities by time of dc but improved.  She was ambulatory while here.  She was placed on a prednisone taper, flexeril and a few hydrocodone tabs also prescribed.  Discussed home care and need for close f/u if sx persist or worsen.  May choose to see pcp, also given referral to neurology for any persistent or worsening sx.    The patient appears reasonably screened and/or stabilized for discharge and I doubt any other medical condition or other [facility] requiring further screening, evaluation, or treatment in the ED at this time prior  to discharge.   Final Clinical Impressions(s) / ED Diagnoses   Final diagnoses:  DDD (degenerative disc disease), cervical  DDD (degenerative disc disease), lumbar    ED Discharge Orders         Ordered    HYDROcodone-acetaminophen (NORCO/VICODIN) 5-325 MG tablet  Every 4 hours PRN     07/04/18 1719    predniSONE (DELTASONE) 10 MG tablet     07/04/18 1719    cyclobenzaprine (FLEXERIL) 5 MG tablet  3 times daily PRN     07/04/18 1719           Burgess Amor, PA-C 07/05/18 1208    Geoffery Lyons, MD 07/05/18 1416

## 2018-07-04 NOTE — Discharge Instructions (Addendum)
Take your next dose of prednisone tomorrow morning.  Use the the other medicines as directed.  Do not drive within 4 hours of taking hydrocodone as this will make you drowsy.  Avoid lifting,  Bending,  Twisting or any other activity that worsens your pain over the next week.  Apply an  icepack  to your lower back for 10-15 minutes every 2 hours for the next 2 days.  You should get rechecked if your symptoms are not better over the next 5 days,  Or you develop increased pain,  Weakness in your leg(s) or loss of bladder or bowel function - these are symptoms of a worse injury. ° °

## 2018-07-07 DIAGNOSIS — M545 Low back pain: Secondary | ICD-10-CM | POA: Diagnosis not present

## 2018-07-07 DIAGNOSIS — M5116 Intervertebral disc disorders with radiculopathy, lumbar region: Secondary | ICD-10-CM | POA: Diagnosis not present

## 2018-07-07 DIAGNOSIS — M5010 Cervical disc disorder with radiculopathy, unspecified cervical region: Secondary | ICD-10-CM | POA: Diagnosis not present

## 2018-07-18 DIAGNOSIS — M545 Low back pain: Secondary | ICD-10-CM | POA: Diagnosis not present

## 2018-09-15 DIAGNOSIS — E782 Mixed hyperlipidemia: Secondary | ICD-10-CM | POA: Diagnosis not present

## 2018-09-21 DIAGNOSIS — E782 Mixed hyperlipidemia: Secondary | ICD-10-CM | POA: Diagnosis not present

## 2018-09-21 DIAGNOSIS — Z0001 Encounter for general adult medical examination with abnormal findings: Secondary | ICD-10-CM | POA: Diagnosis not present

## 2018-09-21 DIAGNOSIS — E6609 Other obesity due to excess calories: Secondary | ICD-10-CM | POA: Diagnosis not present

## 2018-09-21 DIAGNOSIS — R945 Abnormal results of liver function studies: Secondary | ICD-10-CM | POA: Diagnosis not present

## 2019-01-30 ENCOUNTER — Other Ambulatory Visit: Payer: Self-pay

## 2019-01-30 DIAGNOSIS — Z20822 Contact with and (suspected) exposure to covid-19: Secondary | ICD-10-CM

## 2019-01-30 DIAGNOSIS — Z20828 Contact with and (suspected) exposure to other viral communicable diseases: Secondary | ICD-10-CM | POA: Diagnosis not present

## 2019-02-01 LAB — NOVEL CORONAVIRUS, NAA: SARS-CoV-2, NAA: NOT DETECTED

## 2019-03-15 DIAGNOSIS — Z20828 Contact with and (suspected) exposure to other viral communicable diseases: Secondary | ICD-10-CM | POA: Diagnosis not present

## 2019-06-30 IMAGING — MR MRI HEAD WITHOUT CONTRAST
8 of 10 series · 41 of 48 positions shown · non-contrast
Comparison: None.

CLINICAL DATA: Initial evaluation for focal neural deficit, stroke
suspected.

EXAM:
MRI HEAD WITHOUT CONTRAST
TECHNIQUE: Multiplanar, multiecho pulse sequences of the brain and surrounding
structures were obtained without intravenous contrast.

[Series 4: DWI · axial · 3.0mm · 0.76mm/px · z∈[-143,+19]mm · 7 of 55 slices shown (1 of 4)]
[im 1/55]
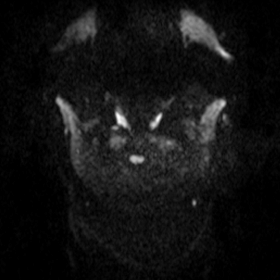
[im 10/55]
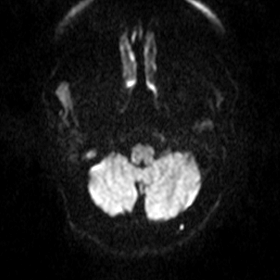
[im 19/55]
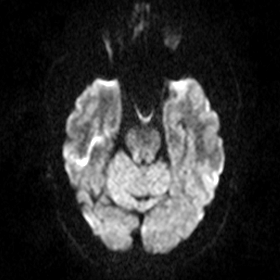
[im 28/55]
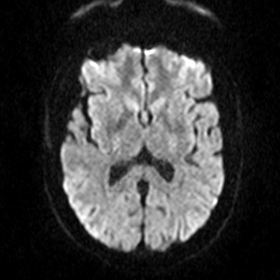
[im 37/55]
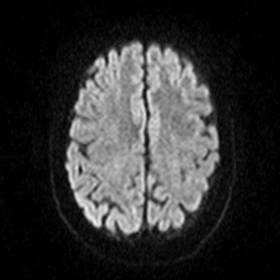
[im 46/55]
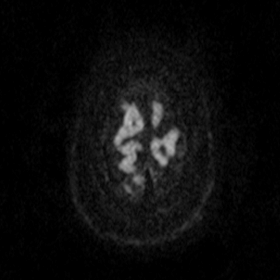
[im 55/55]
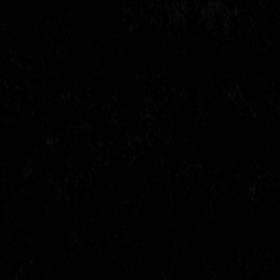

[Series 5: DWI · axial · 3.0mm · 0.76mm/px · z∈[-143,+16]mm · 6 of 52 slices shown (2 of 4)]
[im 1/52]
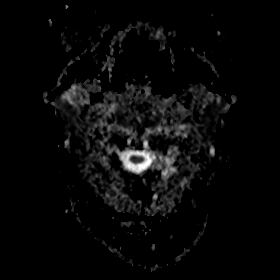
[im 11/52]
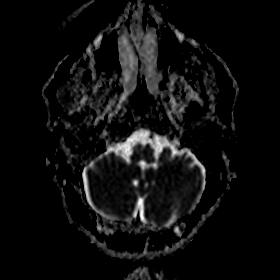
[im 21/52]
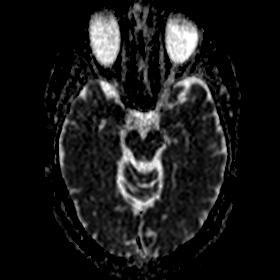
[im 31/52]
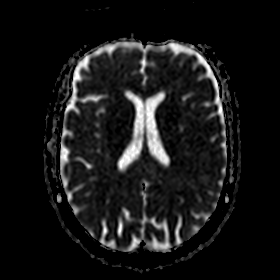
[im 41/52]
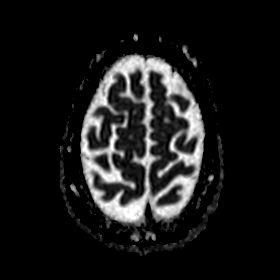
[im 52/52]
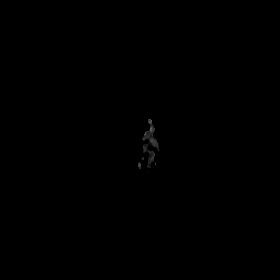

[Series 6: DWI · coronal · 5.0mm · 0.46mm/px · 4 of 34 slices shown (3 of 4)]
[im 1/34]
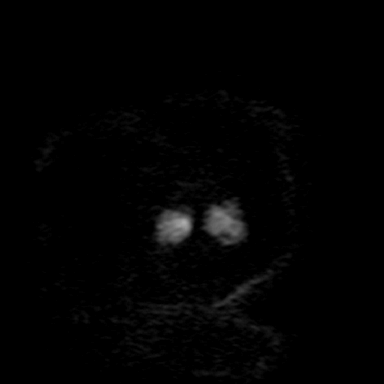
[im 12/34]
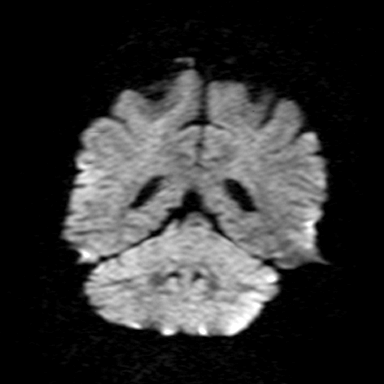
[im 23/34]
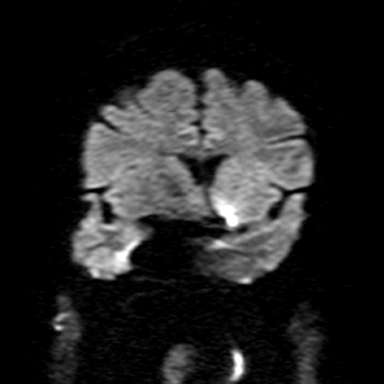
[im 34/34]
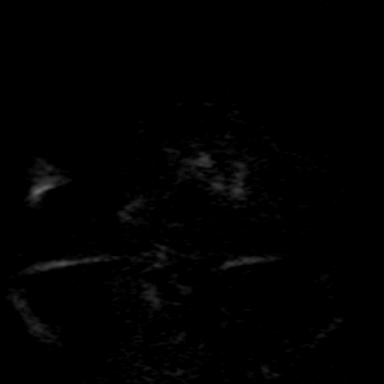

[Series 7: DWI · coronal · 5.0mm · 0.46mm/px · 4 of 33 slices shown (4 of 4)]
[im 1/33]
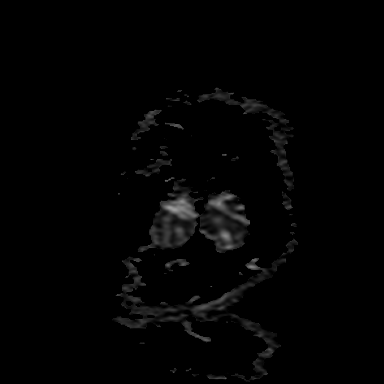
[im 11/33]
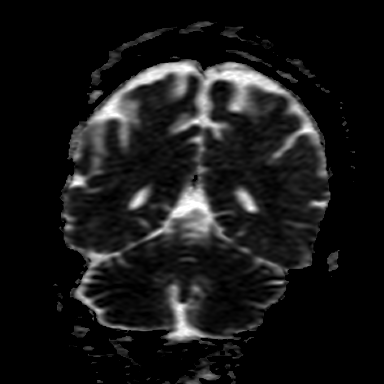
[im 22/33]
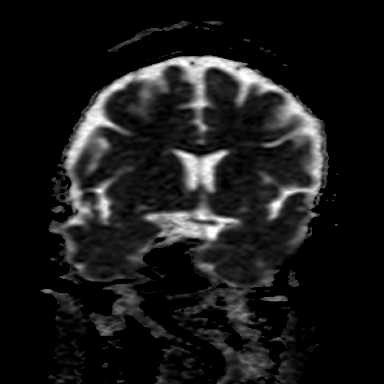
[im 33/33]
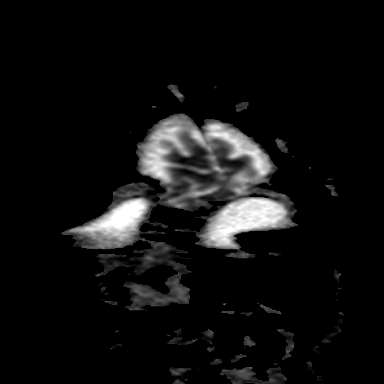

[Series 8: T2 · axial · 5.0mm · 0.65mm/px · z∈[-133,+10]mm · 3 of 23 slices shown (1 of 2)]
[im 1/23]
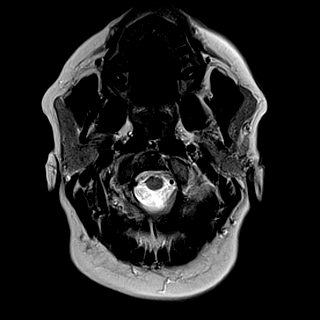
[im 12/23]
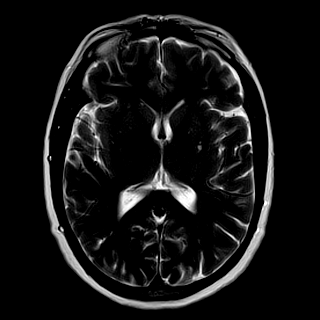
[im 23/23]
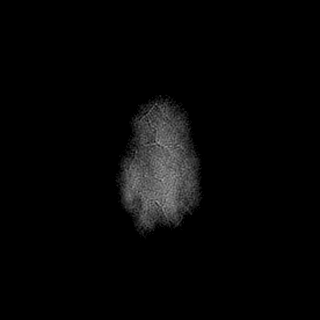

[Series 9: FLAIR · axial · 3.0mm · 0.81mm/px · z∈[-137,+1]mm · 6 of 47 slices shown]
[im 1/47]
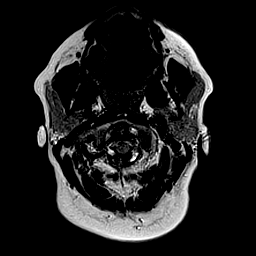
[im 10/47]
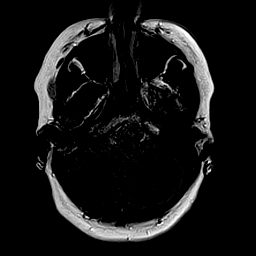
[im 19/47]
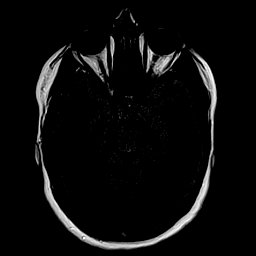
[im 28/47]
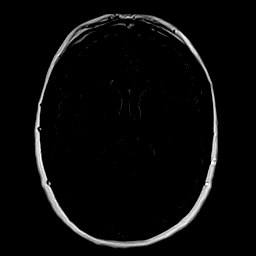
[im 37/47]
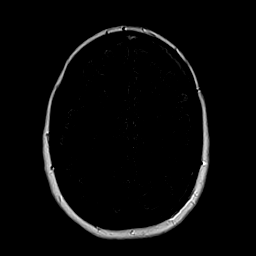
[im 47/47]
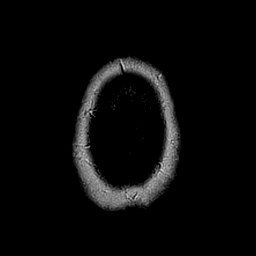

[Series 10: T1 · axial · 2.0mm · 0.41mm/px · z∈[-134,+42]mm · 8 of 89 slices shown]
[im 1/89]
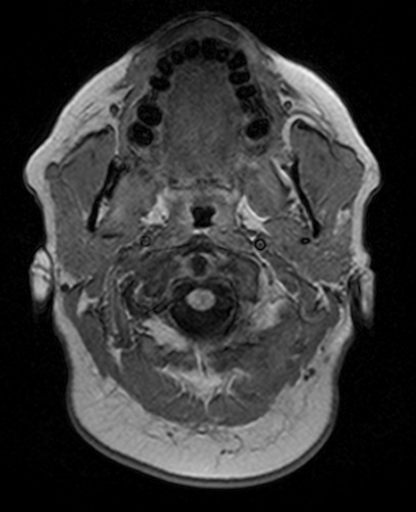
[im 18/89]
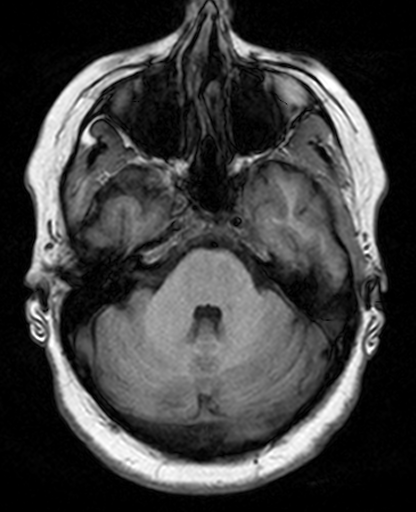
[im 27/89]
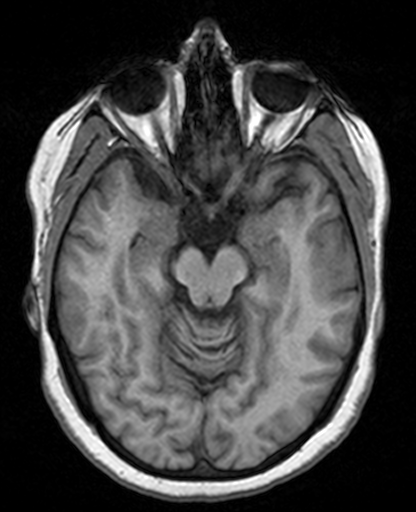
[im 36/89]
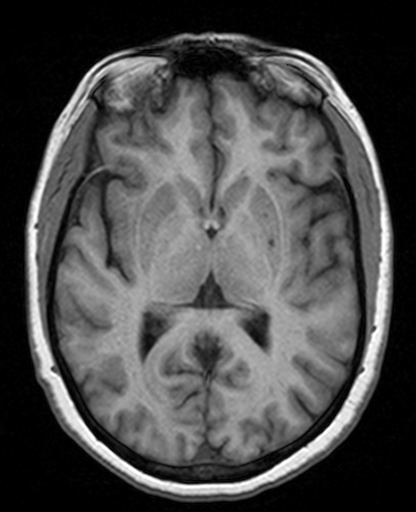
[im 53/89]
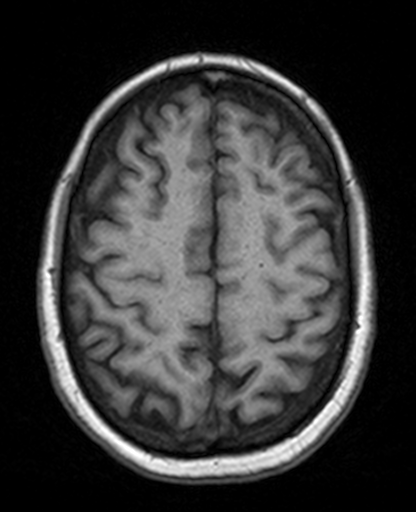
[im 62/89]
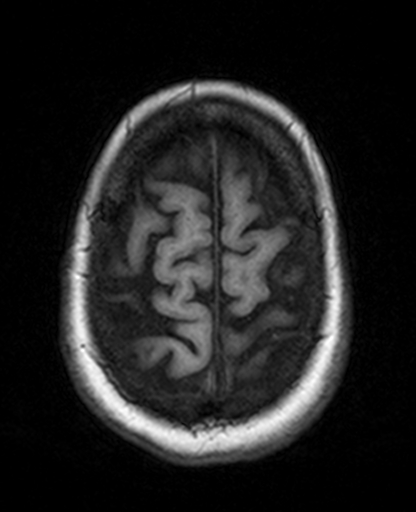
[im 71/89]
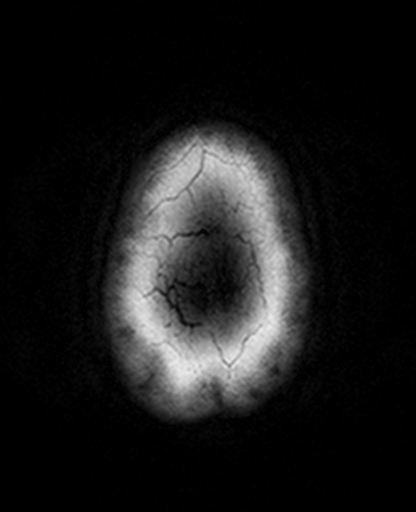
[im 89/89]
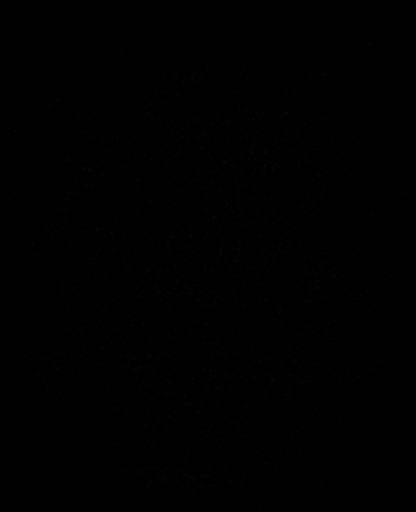

[Series 12: T2 · coronal · 5.0mm · 0.55mm/px · 3 of 28 slices shown (2 of 2)]
[im 1/28]
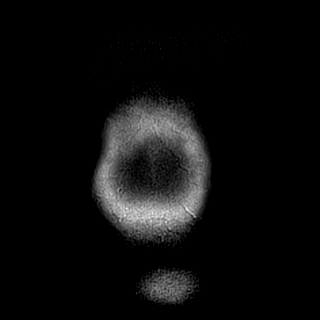
[im 14/28]
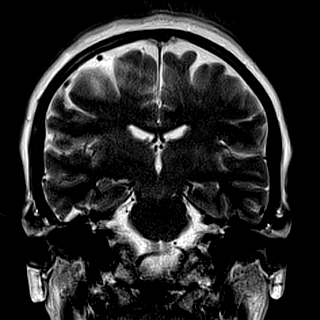
[im 28/28]
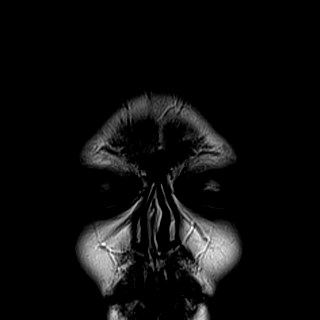

[41 of 48 positions shown; findings below may reference images not displayed]

FINDINGS: Brain: Cerebral volume within normal limits for patient age. No
focal parenchymal signal abnormality identified.

No abnormal foci of restricted diffusion to suggest acute or
subacute ischemia. Gray-white matter differentiation well
maintained. No encephalomalacia to suggest chronic infarction. No
foci of susceptibility artifact to suggest acute or chronic
intracranial hemorrhage.

No mass lesion, midline shift or mass effect. No hydrocephalus. No
extra-axial fluid collection. Major dural sinuses are grossly
patent.

Pituitary gland and suprasellar region are normal. Midline
structures intact and normal.

Vascular: Major intracranial vascular flow voids well maintained and
normal in appearance.

Skull and upper cervical spine: Craniocervical junction normal.
Visualized upper cervical spine within normal limits. Bone marrow
signal intensity normal. No scalp soft tissue abnormality.

Sinuses/Orbits: Globes and orbital soft tissues within normal
limits.

Paranasal sinuses are clear. No mastoid effusion. Inner ear
structures normal.

Other: None.
IMPRESSION: Normal brain MRI.  No acute intracranial abnormality identified.

## 2020-01-15 DIAGNOSIS — R109 Unspecified abdominal pain: Secondary | ICD-10-CM | POA: Diagnosis not present

## 2020-01-15 DIAGNOSIS — N390 Urinary tract infection, site not specified: Secondary | ICD-10-CM | POA: Diagnosis not present

## 2020-02-15 DIAGNOSIS — Z0000 Encounter for general adult medical examination without abnormal findings: Secondary | ICD-10-CM | POA: Diagnosis not present

## 2020-02-15 DIAGNOSIS — G8929 Other chronic pain: Secondary | ICD-10-CM | POA: Diagnosis not present

## 2020-02-15 DIAGNOSIS — M79606 Pain in leg, unspecified: Secondary | ICD-10-CM | POA: Diagnosis not present

## 2020-02-15 DIAGNOSIS — R0981 Nasal congestion: Secondary | ICD-10-CM | POA: Diagnosis not present

## 2020-02-15 DIAGNOSIS — E782 Mixed hyperlipidemia: Secondary | ICD-10-CM | POA: Diagnosis not present

## 2020-02-15 DIAGNOSIS — Z20822 Contact with and (suspected) exposure to covid-19: Secondary | ICD-10-CM | POA: Diagnosis not present

## 2020-04-23 DIAGNOSIS — G8929 Other chronic pain: Secondary | ICD-10-CM | POA: Diagnosis not present

## 2020-04-23 DIAGNOSIS — U071 COVID-19: Secondary | ICD-10-CM | POA: Diagnosis not present

## 2020-04-23 DIAGNOSIS — E782 Mixed hyperlipidemia: Secondary | ICD-10-CM | POA: Diagnosis not present

## 2020-04-23 DIAGNOSIS — M79606 Pain in leg, unspecified: Secondary | ICD-10-CM | POA: Diagnosis not present

## 2020-04-23 DIAGNOSIS — Z0000 Encounter for general adult medical examination without abnormal findings: Secondary | ICD-10-CM | POA: Diagnosis not present

## 2021-10-16 DIAGNOSIS — R591 Generalized enlarged lymph nodes: Secondary | ICD-10-CM | POA: Diagnosis not present

## 2021-10-16 DIAGNOSIS — B029 Zoster without complications: Secondary | ICD-10-CM | POA: Diagnosis not present

## 2022-11-12 ENCOUNTER — Ambulatory Visit (INDEPENDENT_AMBULATORY_CARE_PROVIDER_SITE_OTHER): Payer: Self-pay | Admitting: Podiatry

## 2022-11-12 ENCOUNTER — Ambulatory Visit (INDEPENDENT_AMBULATORY_CARE_PROVIDER_SITE_OTHER): Payer: Self-pay

## 2022-11-12 DIAGNOSIS — M7731 Calcaneal spur, right foot: Secondary | ICD-10-CM

## 2022-11-12 DIAGNOSIS — M722 Plantar fascial fibromatosis: Secondary | ICD-10-CM

## 2022-11-12 DIAGNOSIS — M7732 Calcaneal spur, left foot: Secondary | ICD-10-CM

## 2022-11-12 DIAGNOSIS — M79671 Pain in right foot: Secondary | ICD-10-CM

## 2022-11-12 DIAGNOSIS — M792 Neuralgia and neuritis, unspecified: Secondary | ICD-10-CM

## 2022-11-12 MED ORDER — METHYLPREDNISOLONE 4 MG PO TBPK: ORAL_TABLET | ORAL | 0 refills | Status: AC

## 2022-11-12 MED ORDER — TERBINAFINE HCL 1 % EX CREA: 1.0000 | TOPICAL_CREAM | Freq: Two times a day (BID) | CUTANEOUS | 0 refills | Status: AC

## 2022-11-12 NOTE — Patient Instructions (Addendum)
For shoes I would recommend going to [employer]  Plantar Fasciitis (Heel Spur Syndrome) with Rehab The plantar fascia is a fibrous, ligament-like, soft-tissue structure that spans the bottom of the foot. Plantar fasciitis is a condition that causes pain in the foot due to inflammation of the tissue. SYMPTOMS  Pain and tenderness on the underneath side of the foot. Pain that worsens with standing or walking. CAUSES  Plantar fasciitis is caused by irritation and injury to the plantar fascia on the underneath side of the foot. Common mechanisms of injury include: Direct trauma to bottom of the foot. Damage to a small nerve that runs under the foot where the main fascia attaches to the heel bone. Stress placed on the plantar fascia due to bone spurs. RISK INCREASES WITH:  Activities that place stress on the plantar fascia (running, jumping, pivoting, or cutting). Poor strength and flexibility. Improperly fitted shoes. Tight calf muscles. Flat feet. Failure to warm-up properly before activity. Obesity. PREVENTION Warm up and stretch properly before activity. Allow for adequate recovery between workouts. Maintain physical fitness: Strength, flexibility, and endurance. Cardiovascular fitness. Maintain a health body weight. Avoid stress on the plantar fascia. Wear properly fitted shoes, including arch supports for individuals who have flat feet.  PROGNOSIS  If treated properly, then the symptoms of plantar fasciitis usually resolve without surgery. However, occasionally surgery is necessary.  RELATED COMPLICATIONS  Recurrent symptoms that may result in a chronic condition. Problems of the lower back that are caused by compensating for the injury, such as limping. Pain or weakness of the foot during push-off following surgery. Chronic inflammation, scarring, and partial or complete fascia tear, occurring more often from repeated injections.  TREATMENT  Treatment initially involves the  use of ice and medication to help reduce pain and inflammation. The use of strengthening and stretching exercises may help reduce pain with activity, especially stretches of the Achilles tendon. These exercises may be performed at home or with a therapist. Your caregiver may recommend that you use heel cups of arch supports to help reduce stress on the plantar fascia. Occasionally, corticosteroid injections are given to reduce inflammation. If symptoms persist for greater than 6 months despite non-surgical (conservative), then surgery may be recommended.   MEDICATION  If pain medication is necessary, then nonsteroidal anti-inflammatory medications, such as aspirin and ibuprofen, or other minor pain relievers, such as acetaminophen, are often recommended. Do not take pain medication within 7 days before surgery. Prescription pain relievers may be given if deemed necessary by your caregiver. Use only as directed and only as much as you need. Corticosteroid injections may be given by your caregiver. These injections should be reserved for the most serious cases, because they may only be given a certain number of times.  HEAT AND COLD Cold treatment (icing) relieves pain and reduces inflammation. Cold treatment should be applied for 10 to 15 minutes every 2 to 3 hours for inflammation and pain and immediately after any activity that aggravates your symptoms. Use ice packs or massage the area with a piece of ice (ice massage). Heat treatment may be used prior to performing the stretching and strengthening activities prescribed by your caregiver, physical therapist, or athletic trainer. Use a heat pack or soak the injury in warm water.  SEEK IMMEDIATE MEDICAL CARE IF: Treatment seems to offer no benefit, or the condition worsens. Any medications produce adverse side effects.  EXERCISES- RANGE OF MOTION (ROM) AND STRETCHING EXERCISES - Plantar Fasciitis (Heel Spur Syndrome) These exercises may  help you  when beginning to rehabilitate your injury. Your symptoms may resolve with or without further involvement from your physician, physical therapist or athletic trainer. While completing these exercises, remember:  Restoring tissue flexibility helps normal motion to return to the joints. This allows healthier, less painful movement and activity. An effective stretch should be held for at least 30 seconds. A stretch should never be painful. You should only feel a gentle lengthening or release in the stretched tissue.  RANGE OF MOTION - Toe Extension, Flexion Sit with your right / left leg crossed over your opposite knee. Grasp your toes and gently pull them back toward the top of your foot. You should feel a stretch on the bottom of your toes and/or foot. Hold this stretch for 10 seconds. Now, gently pull your toes toward the bottom of your foot. You should feel a stretch on the top of your toes and or foot. Hold this stretch for 10 seconds. Repeat  times. Complete this stretch 3 times per day.   RANGE OF MOTION - Ankle Dorsiflexion, Active Assisted Remove shoes and sit on a chair that is preferably not on a carpeted surface. Place right / left foot under knee. Extend your opposite leg for support. Keeping your heel down, slide your right / left foot back toward the chair until you feel a stretch at your ankle or calf. If you do not feel a stretch, slide your bottom forward to the edge of the chair, while still keeping your heel down. Hold this stretch for 10 seconds. Repeat 3 times. Complete this stretch 2 times per day.   STRETCH  Gastroc, Standing Place hands on wall. Extend right / left leg, keeping the front knee somewhat bent. Slightly point your toes inward on your back foot. Keeping your right / left heel on the floor and your knee straight, shift your weight toward the wall, not allowing your back to arch. You should feel a gentle stretch in the right / left calf. Hold this position for  10 seconds. Repeat 3 times. Complete this stretch 2 times per day.  STRETCH  Soleus, Standing Place hands on wall. Extend right / left leg, keeping the other knee somewhat bent. Slightly point your toes inward on your back foot. Keep your right / left heel on the floor, bend your back knee, and slightly shift your weight over the back leg so that you feel a gentle stretch deep in your back calf. Hold this position for 10 seconds. Repeat 3 times. Complete this stretch 2 times per day.  STRETCH  Gastrocsoleus, Standing  Note: This exercise can place a lot of stress on your foot and ankle. Please complete this exercise only if specifically instructed by your caregiver.  Place the ball of your right / left foot on a step, keeping your other foot firmly on the same step. Hold on to the wall or a rail for balance. Slowly lift your other foot, allowing your body weight to press your heel down over the edge of the step. You should feel a stretch in your right / left calf. Hold this position for 10 seconds. Repeat this exercise with a slight bend in your right / left knee. Repeat 3 times. Complete this stretch 2 times per day.   STRENGTHENING EXERCISES - Plantar Fasciitis (Heel Spur Syndrome)  These exercises may help you when beginning to rehabilitate your injury. They may resolve your symptoms with or without further involvement from your physician, physical therapist or  Event organiser. While completing these exercises, remember:  Muscles can gain both the endurance and the strength needed for everyday activities through controlled exercises. Complete these exercises as instructed by your physician, physical therapist or athletic trainer. Progress the resistance and repetitions only as guided.  STRENGTH - Towel Curls Sit in a chair positioned on a non-carpeted surface. Place your foot on a towel, keeping your heel on the floor. Pull the towel toward your heel by only curling your toes. Keep  your heel on the floor. Repeat 3 times. Complete this exercise 2 times per day.  STRENGTH - Ankle Inversion Secure one end of a rubber exercise band/tubing to a fixed object (table, pole). Loop the other end around your foot just before your toes. Place your fists between your knees. This will focus your strengthening at your ankle. Slowly, pull your big toe up and in, making sure the band/tubing is positioned to resist the entire motion. Hold this position for 10 seconds. Have your muscles resist the band/tubing as it slowly pulls your foot back to the starting position. Repeat 3 times. Complete this exercises 2 times per day.  Document Released: 04/12/2005 Document Revised: 07/05/2011 Document Reviewed: 07/25/2008 [facility] Patient Information 2014 [place], [place].

## 2022-11-12 NOTE — Progress Notes (Signed)
Subjective:   Patient ID: Alexandra Espinoza, female   DOB: 52 y.o.   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  She does describe burning and tingling.  She states that it feels like stuck a match on the side.  Bought sleeves to help heel pain as she told it was chronic as she has previously.  Helps some.  States that she does have 5 bulging disks in her back.  Also concern about nail fungus.  Nails are discolored.  No pain of the nails.   Review of Systems  All other systems reviewed and are negative.  Past Medical History:  Diagnosis Date   #196052    left foot fracture    Past Surgical History:  Procedure Laterality Date   ABDOMINAL SURGERY     APPENDECTOMY  1990   COLECTOMY  2002   GANGLION CYST EXCISION     wrist   tubal ligation     VAGINAL HYSTERECTOMY  2009     Current Outpatient Medications:    methylPREDNISolone (MEDROL DOSEPAK) 4 MG TBPK tablet, Take as directed, Disp: 21 tablet, Rfl: 0   terbinafine (LAMISIL) 1 % cream, Apply 1 Application topically 2 (two) times daily., Disp: 30 g, Rfl: 0   acetaminophen (TYLENOL) 500 MG tablet, Take 500 mg by mouth every 6 (six) hours as needed., Disp: , Rfl:    Aspirin-Salicylamide-Caffeine (BC HEADACHE POWDER PO), Take 1 packet by mouth daily., Disp: , Rfl:    cyclobenzaprine (FLEXERIL) 5 MG tablet, Take 1 tablet (5 mg total) by mouth 3 (three) times daily as needed for muscle spasms., Disp: 15 tablet, Rfl: 0   diclofenac sodium  (VOLTAREN) 1 % GEL, Apply 4 g topically 4 (four) times daily. (Patient not taking: Reported on 07/04/2018), Disp: 5 Tube, Rfl: 3   HYDROcodone-acetaminophen (NORCO/VICODIN) 5-325 MG tablet, Take 1 tablet by mouth every 4 (four) hours as needed., Disp: 20 tablet, Rfl: 0   predniSONE (DELTASONE) 10 MG tablet, Take 6 tablets day one, 5 tablets day two, 4 tablets day three, 3 tablets day four, 2 tablets day five, then 1 tablet day six, Disp: 21 tablet, Rfl: 0  Allergies  Allergen Reactions   Metronidazole Diarrhea and Nausea And Vomiting   Penicillin G Nausea Only          Objective:  Physical Exam  General: AAO x3, NAD  Dermatological: Nails are mildly dystrophic with yellow, brown discoloration.  No edema, erythema or signs of infection.  No open lesions.  Vascular: Dorsalis Pedis artery and Posterior Tibial artery pedal pulses are 2/4 bilateral with immedate capillary fill time. There is no pain with calf compression, swelling, warmth, erythema.   Neruologic: Grossly intact via light touch bilateral.   Musculoskeletal: Tenderness to palpation along the plantar medial tubercle of the calcaneus at the insertion of plantar fascia. There is no pain  along the course of the plantar fascia within the arch of the foot. Plantar fascia appears to be intact. There is no pain with lateral compression of the calcaneus or pain with vibratory sensation. There is no pain along the course or insertion of the achilles tendon. No other areas of tenderness to bilateral lower extremities.  Gait: Unassisted, Nonantalgic.       Assessment:   Heel pain, plantar fasciitis; back pain     Plan:  -Treatment options discussed including all alternatives, risks, and complications -Etiology of symptoms were discussed -X-rays were obtained and reviewed with the patient.  3 views of both feet were obtained.  No evidence of acute fracture noted at this time.  Calcaneal spurring is present.  Increased calcaneal  inclination angle. -While do think that her symptoms are consistent with chronic fasciitis she also does have some nerve symptoms which could be coming from her back.  I recommend her to follow-up with her PCP in regards to evaluation of this or referral to a back specialist. -Regards to her foot pain we discussed stretching, icing on a regular basis.  Prescribed Medrol Dosepak.  Discussed shoes, good arch support.  Consider advanced imaging if needed. -Terbinafine cream to help with the toenails.  Return in about 6 weeks (around 12/24/2022).    Vivi Barrack DPM

## 2022-11-23 ENCOUNTER — Other Ambulatory Visit: Payer: Self-pay | Admitting: Podiatry

## 2022-11-23 DIAGNOSIS — M7732 Calcaneal spur, left foot: Secondary | ICD-10-CM

## 2022-11-23 DIAGNOSIS — M722 Plantar fascial fibromatosis: Secondary | ICD-10-CM

## 2022-11-23 DIAGNOSIS — M792 Neuralgia and neuritis, unspecified: Secondary | ICD-10-CM

## 2022-11-23 DIAGNOSIS — M7731 Calcaneal spur, right foot: Secondary | ICD-10-CM

## 2022-12-30 ENCOUNTER — Ambulatory Visit: Payer: Self-pay | Admitting: Podiatry

## 2024-01-09 ENCOUNTER — Other Ambulatory Visit (HOSPITAL_COMMUNITY): Payer: Self-pay

## 2024-01-09 DIAGNOSIS — Z1231 Encounter for screening mammogram for malignant neoplasm of breast: Secondary | ICD-10-CM
